# Patient Record
Sex: Female | Born: 1970 | Race: Black or African American | Hispanic: No | Marital: Single | Smoking: Never smoker
Health system: Southern US, Community
[De-identification: ages and names within clinical notes are randomized; demographics above are authoritative.]

## PROBLEM LIST (undated history)

## (undated) DIAGNOSIS — T7840XA Allergy, unspecified, initial encounter: Secondary | ICD-10-CM

## (undated) DIAGNOSIS — D573 Sickle-cell trait: Secondary | ICD-10-CM

## (undated) DIAGNOSIS — D571 Sickle-cell disease without crisis: Secondary | ICD-10-CM

## (undated) HISTORY — DX: Sickle-cell disease without crisis: D57.1

## (undated) HISTORY — DX: Allergy, unspecified, initial encounter: T78.40XA

## (undated) HISTORY — DX: Sickle-cell trait: D57.3

---

## 1994-05-27 HISTORY — PX: TUBAL LIGATION: SHX77

## 1996-05-27 HISTORY — PX: BREAST BIOPSY: SHX20

## 1998-12-19 ENCOUNTER — Encounter: Payer: Self-pay | Admitting: Internal Medicine

## 1998-12-19 ENCOUNTER — Emergency Department (HOSPITAL_COMMUNITY): Admission: EM | Admit: 1998-12-19 | Discharge: 1998-12-19 | Payer: Self-pay | Admitting: Internal Medicine

## 2000-10-23 ENCOUNTER — Encounter: Admission: RE | Admit: 2000-10-23 | Discharge: 2000-10-23 | Payer: Self-pay | Admitting: Family Medicine

## 2001-03-20 ENCOUNTER — Other Ambulatory Visit: Admission: RE | Admit: 2001-03-20 | Discharge: 2001-03-20 | Payer: Self-pay | Admitting: Obstetrics and Gynecology

## 2001-12-07 ENCOUNTER — Encounter: Admission: RE | Admit: 2001-12-07 | Discharge: 2001-12-07 | Payer: Self-pay | Admitting: Family Medicine

## 2001-12-07 ENCOUNTER — Encounter: Payer: Self-pay | Admitting: Family Medicine

## 2003-07-08 ENCOUNTER — Encounter: Admission: RE | Admit: 2003-07-08 | Discharge: 2003-07-08 | Payer: Self-pay | Admitting: Family Medicine

## 2004-02-08 ENCOUNTER — Encounter: Payer: Self-pay | Admitting: Family Medicine

## 2004-02-08 ENCOUNTER — Ambulatory Visit: Payer: Self-pay | Admitting: Family Medicine

## 2004-04-25 ENCOUNTER — Ambulatory Visit: Payer: Self-pay | Admitting: Family Medicine

## 2004-10-24 ENCOUNTER — Ambulatory Visit: Payer: Self-pay | Admitting: Family Medicine

## 2004-10-26 ENCOUNTER — Ambulatory Visit: Payer: Self-pay | Admitting: Family Medicine

## 2005-10-31 ENCOUNTER — Encounter (INDEPENDENT_AMBULATORY_CARE_PROVIDER_SITE_OTHER): Payer: Self-pay | Admitting: *Deleted

## 2005-10-31 LAB — CONVERTED CEMR LAB

## 2005-11-06 ENCOUNTER — Ambulatory Visit: Payer: Self-pay | Admitting: Family Medicine

## 2006-04-22 ENCOUNTER — Ambulatory Visit: Payer: Self-pay | Admitting: Family Medicine

## 2006-07-25 ENCOUNTER — Encounter (INDEPENDENT_AMBULATORY_CARE_PROVIDER_SITE_OTHER): Payer: Self-pay | Admitting: *Deleted

## 2006-08-12 ENCOUNTER — Telehealth: Payer: Self-pay | Admitting: *Deleted

## 2006-08-20 ENCOUNTER — Telehealth (INDEPENDENT_AMBULATORY_CARE_PROVIDER_SITE_OTHER): Payer: Self-pay | Admitting: *Deleted

## 2006-08-21 ENCOUNTER — Ambulatory Visit: Payer: Self-pay | Admitting: Sports Medicine

## 2007-02-13 ENCOUNTER — Encounter (INDEPENDENT_AMBULATORY_CARE_PROVIDER_SITE_OTHER): Payer: Self-pay | Admitting: Family Medicine

## 2007-02-13 ENCOUNTER — Ambulatory Visit: Payer: Self-pay | Admitting: Family Medicine

## 2007-02-13 ENCOUNTER — Other Ambulatory Visit: Admission: RE | Admit: 2007-02-13 | Discharge: 2007-02-13 | Payer: Self-pay | Admitting: Family Medicine

## 2007-02-13 LAB — CONVERTED CEMR LAB
Bilirubin Urine: NEGATIVE
Blood in Urine, dipstick: NEGATIVE
Chlamydia, DNA Probe: NEGATIVE
GC Probe Amp, Genital: NEGATIVE
Glucose, Urine, Semiquant: NEGATIVE
Ketones, urine, test strip: NEGATIVE
Nitrite: NEGATIVE
Protein, U semiquant: NEGATIVE
Specific Gravity, Urine: 1.02
Urobilinogen, UA: 0.2
Whiff Test: POSITIVE
pH: 6.5

## 2007-02-16 ENCOUNTER — Encounter (INDEPENDENT_AMBULATORY_CARE_PROVIDER_SITE_OTHER): Payer: Self-pay | Admitting: Family Medicine

## 2007-04-29 ENCOUNTER — Encounter: Payer: Self-pay | Admitting: *Deleted

## 2007-07-14 ENCOUNTER — Telehealth: Payer: Self-pay | Admitting: *Deleted

## 2007-07-20 ENCOUNTER — Encounter: Admission: RE | Admit: 2007-07-20 | Discharge: 2007-07-20 | Payer: Self-pay | Admitting: Obstetrics

## 2008-04-18 ENCOUNTER — Encounter (INDEPENDENT_AMBULATORY_CARE_PROVIDER_SITE_OTHER): Payer: Self-pay | Admitting: Family Medicine

## 2008-04-18 ENCOUNTER — Encounter: Payer: Self-pay | Admitting: *Deleted

## 2008-06-03 ENCOUNTER — Encounter (INDEPENDENT_AMBULATORY_CARE_PROVIDER_SITE_OTHER): Payer: Self-pay | Admitting: Family Medicine

## 2008-06-03 ENCOUNTER — Ambulatory Visit: Payer: Self-pay | Admitting: Family Medicine

## 2008-06-03 LAB — CONVERTED CEMR LAB
Beta hcg, urine, semiquantitative: NEGATIVE
Bilirubin Urine: NEGATIVE
Glucose, Urine, Semiquant: NEGATIVE
Protein, U semiquant: NEGATIVE
Specific Gravity, Urine: 1.02
WBC Urine, dipstick: NEGATIVE
pH: 5.5

## 2008-06-06 LAB — CONVERTED CEMR LAB: Chlamydia, DNA Probe: NEGATIVE

## 2008-11-08 ENCOUNTER — Telehealth (INDEPENDENT_AMBULATORY_CARE_PROVIDER_SITE_OTHER): Payer: Self-pay | Admitting: Family Medicine

## 2008-11-08 ENCOUNTER — Ambulatory Visit: Payer: Self-pay | Admitting: Family Medicine

## 2008-11-08 DIAGNOSIS — IMO0002 Reserved for concepts with insufficient information to code with codable children: Secondary | ICD-10-CM | POA: Insufficient documentation

## 2008-12-27 ENCOUNTER — Telehealth: Payer: Self-pay | Admitting: Family Medicine

## 2009-10-19 ENCOUNTER — Encounter: Payer: Self-pay | Admitting: Family Medicine

## 2009-10-19 ENCOUNTER — Ambulatory Visit: Payer: Self-pay | Admitting: Family Medicine

## 2009-10-19 DIAGNOSIS — B009 Herpesviral infection, unspecified: Secondary | ICD-10-CM | POA: Insufficient documentation

## 2009-10-19 DIAGNOSIS — R609 Edema, unspecified: Secondary | ICD-10-CM | POA: Insufficient documentation

## 2010-06-26 NOTE — Assessment & Plan Note (Signed)
Summary: facial swelling/[place]/carew   Vital Signs:  Patient profile:   40 year old female Weight:      178.2 pounds Temp:     98.4 degrees F oral Pulse rate:   68 / minute BP sitting:   112 / 69  (left arm) Cuff size:   regular  Vitals Entered By: Garen Grams LPN (Oct 19, 2009 10:56 AM) CC: lip swelling Is Patient Diabetic? No Pain Assessment Patient in pain? no        Primary Care Provider:  Bobby Rumpf  MD  CC:  lip swelling.  History of Present Illness: 1. Lip swelling and fever blister:  Pt noticed a fever blister on her upper lip yesterday.  She started putting Abreva on it yesterday and was doing fine when she went to bed last night.  She woke up this morning with her upper lip swollen and painful.  She was also having some face itching but no swelling.  The swelling is getting worse.  It is associated with facial itching but no rash.  She hasn't tried anything to make it better.  Nothing makes it worse.  She has never had something like this happen before.       ROS: she denies tongue swelling, throat swelling, difficulty swallowing, wheezing, difficult                 breathing.  She is unsure if her lower lip is swollen as well.      PMHx: No hx of allergic reactions before      Meds:  No new medicines besides the Abreva  Habits & Providers  Alcohol-Tobacco-Diet     Tobacco Status: never  Allergies: No Known Drug Allergies  Past History:  Social History: Last updated: 02/13/2007 58yrs post high school education, No tobacco or drug history.  EtOH socially.  Lives with 2 children and boyfriend.  works at [employer].  Physical Exam  General:  Well-developed,overweight,in no acute distress; alert,appropriate and cooperative throughout examination VSS Eyes:  normal conjuctiva Mouth:  Upper lip is grossly swollen in the middle.  She has a cluster of vesicles on the upper lip.  Bottom lip may be slightly swollen diffusely but this may be her baseline.  OP  pink and moist.  Normal appearing tongue.  Airway is open without compromise. Neck:  no soft tissue swelling or rashes Lungs:  Normal respiratory effort, chest expands symmetrically. Lungs are clear to auscultation, no crackles or wheezes. Heart:  normal rate and regular rhythm.   Skin:  vesicular rash on upper lip Psych:  not anxious appearing.   Impression & Recommendations:  Problem # 1:  EDEMA (ICD-782.3) Assessment New  Discussed case with Dr. Donalee Citrin.  Localized edema of the upper lip either due to HSV infection or reaction to the Abreva.  No airway obstruction or compromise.  Will treat with with H1/H2 blockers.  Will hold off on steroids given the herpes outbreak.  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 1 by mouth three times a day as needed for pain 2)  Valacyclovir Hcl 500 Mg Tabs (Valacyclovir hcl) .... Take 4 tabs by mouth twice a day for 1 day 3)  Hydroxyzine Hcl 25  Mg Tabs (Hydroxyzine hcl) .... Take 1 tab by mouth every twice a day for itching 4)  Zyrtec Allergy 10 Mg Tabs (Cetirizine hcl) .... Take 1 tab by mouth daily for itching 5)  Ranitidine Hcl 75 Mg Tabs (Ranitidine hcl) .... Take 1 tab by mouth twice a day as needed for itching  Patient Instructions: 1)  We will treat you for the blisters and for the swelling / itching 2)  For the blisters I want you to take Valtrex.  You will take 4 tabs twice a day for one day. 3)  For the swelling and the itching I have prescribed a couple different medicines 4)  If you start to notice tongue or throat swelling or difficulty breathing or the swelling gets much worse than you need to go to the Emergency Room Prescriptions: RANITIDINE HCL 75 MG TABS (RANITIDINE HCL)  Take 1 tab by mouth twice a day as needed for itching  #30 x 0   Entered and Authorized by:   Angelena Sole MD   Signed by:   Angelena Sole MD on 10/19/2009   Method used:   Electronically to        [facility] [id number]* (retail)       [address] [place], [place]  [address]       Ph: [phone]       Fax: [phone]   RxID:   [id number] ZYRTEC ALLERGY 10 MG TABS (CETIRIZINE HCL) Take 1 tab by mouth daily for itching  #20 x 0   Entered and Authorized by:   Angelena Sole MD   Signed by:   Angelena Sole MD on 10/19/2009   Method used:   Electronically to        [facility] #[id number]* (retail)       [address] [place], [place]  [address]       Ph: [phone]       Fax: [phone]   RxID:   [id number] HYDROXYZINE HCL 25 MG TABS (HYDROXYZINE HCL) Take 1 tab by mouth every twice a day for itching  #20 x 0   Entered and Authorized by:   Angelena Sole MD   Signed by:   Angelena Sole MD on 10/19/2009   Method used:   Electronically to        [id number]* (retail)       [address] [place], [place]  [address]       Ph: [phone]       Fax: [phone]   RxID:   [id number] VALACYCLOVIR HCL 500 MG TABS (VALACYCLOVIR HCL) take 4 tabs by mouth twice a day for 1 day  #8 x 0   Entered and Authorized by:   Angelena Sole MD   Signed by:   Angelena Sole MD on 10/19/2009   Method used:   Electronically to        [id number]* (retail)       [address] [place], [place]  [address]       Ph: [phone]       Fax: [phone]   RxID:   [id number]

## 2010-06-26 NOTE — Miscellaneous (Signed)
Summary: face swollen  Clinical Lists Changes states she has facial swelling. she had used abbreva on a fever blister & now has swelling. takes no meds per pt. asked her to come now. speaking well. no problems swallowing...Marland KitchenMarland KitchenGolden Circle RN  Oct 19, 2009 10:35 AM  Reviewed Dr. Lelon Perla note. Bobby Rumpf  MD  Oct 19, 2009 4:47 PM

## 2011-08-29 ENCOUNTER — Encounter: Payer: Self-pay | Admitting: Family Medicine

## 2012-06-22 ENCOUNTER — Telehealth: Payer: Self-pay | Admitting: Family Medicine

## 2012-06-22 NOTE — Telephone Encounter (Signed)
Please call patient per daughter.

## 2012-06-22 NOTE — Telephone Encounter (Signed)
Spoke with [name] for Clarification.  Daughter came in for an appt and in passing told [name] that her "mom needed a nurse to call her"  Attempted to call but no answer, LMOVM for pt to return call.  When she calls back please get some information.  Also pt has not been seen at all since epic.  If it is for medical related problems or med refills . [place], [name]

## 2016-08-13 ENCOUNTER — Ambulatory Visit (INDEPENDENT_AMBULATORY_CARE_PROVIDER_SITE_OTHER): Payer: BLUE CROSS/BLUE SHIELD | Admitting: Obstetrics

## 2016-08-13 ENCOUNTER — Encounter: Payer: Self-pay | Admitting: Obstetrics

## 2016-08-13 VITALS — BP 88/59 | HR 70 | Ht 60.0 in | Wt 156.0 lb

## 2016-08-13 DIAGNOSIS — Z124 Encounter for screening for malignant neoplasm of cervix: Secondary | ICD-10-CM

## 2016-08-13 DIAGNOSIS — Z113 Encounter for screening for infections with a predominantly sexual mode of transmission: Secondary | ICD-10-CM

## 2016-08-13 DIAGNOSIS — N946 Dysmenorrhea, unspecified: Secondary | ICD-10-CM

## 2016-08-13 DIAGNOSIS — Z0000 Encounter for general adult medical examination without abnormal findings: Secondary | ICD-10-CM

## 2016-08-13 DIAGNOSIS — Z01419 Encounter for gynecological examination (general) (routine) without abnormal findings: Secondary | ICD-10-CM

## 2016-08-13 DIAGNOSIS — N939 Abnormal uterine and vaginal bleeding, unspecified: Secondary | ICD-10-CM

## 2016-08-13 DIAGNOSIS — Z1239 Encounter for other screening for malignant neoplasm of breast: Secondary | ICD-10-CM

## 2016-08-13 MED ORDER — IBUPROFEN 800 MG PO TABS: 800.0000 mg | ORAL_TABLET | Freq: Three times a day (TID) | ORAL | 5 refills | Status: DC | PRN

## 2016-08-13 NOTE — Progress Notes (Signed)
Pt presents for annual, pap, all STD testing, and MGM order. Pt c/o heavy cycles with half dollar size clots, changing tampons every 2 hrs. They are irregular, sometimes they are late and last 3-7 days.

## 2016-08-13 NOTE — Progress Notes (Signed)
Subjective:        Wanda Foster is a 46 y.o. female here for a routine exam.  Current complaints: Heavy and painful periods..    Personal health questionnaire:  Is patient Ashkenazi Jewish, have a family history of breast and/or ovarian cancer: no Is there a family history of uterine cancer diagnosed at age < 87, gastrointestinal cancer, urinary tract cancer, family member who is a Field seismologist syndrome-associated carrier: no Is the patient overweight and hypertensive, family history of diabetes, personal history of gestational diabetes, preeclampsia or PCOS: no Is patient over 60, have PCOS,  family history of premature CHD under age 48, diabetes, smoke, have hypertension or peripheral artery disease:  no At any time, has a partner hit, kicked or otherwise hurt or frightened you?: no Over the past 2 weeks, have you felt down, depressed or hopeless?: no Over the past 2 weeks, have you felt little interest or pleasure in doing things?:no   Gynecologic History Patient's last menstrual period was 08/06/2016. Contraception: tubal ligation Last Pap: Unknown. Results were: normal Last mammogram: 2009. Results were: normal  Obstetric History OB History  Gravida Para Term Preterm AB Living  4 2 2   2 2   SAB TAB Ectopic Multiple Live Births    2          # Outcome Date GA Lbr Len/2nd Weight Sex Delivery Anes PTL Lv  4 Term 10/04/92 [redacted]w[redacted]d   F CS-LTranv     3 Term 01/14/91 [redacted]w[redacted]d   F Vag-Spont     2 TAB           1 TAB               Past Medical History:  Diagnosis Date  . Sickle cell trait [facility])     Past Surgical History:  Procedure Laterality Date  . BREAST BIOPSY  1998   unsure which breast  . [place]  . TUBAL LIGATION  1996    No current outpatient prescriptions on file. No Known Allergies  Social History  Substance Use Topics  . Smoking status: Never Smoker  . Smokeless tobacco: Never Used  . Alcohol use Yes     Comment: occ    Family History  Problem  Relation Age of Onset  . Asthma Sister   . Sickle cell trait Sister   . Sickle cell trait Brother   . Diabetes Maternal Grandmother   . Hypertension Maternal Grandmother   . Cervical cancer Maternal Grandmother 60  . Diabetes Maternal Grandfather   . Hypertension Maternal Grandfather   . Diabetes Paternal Grandmother   . Hypertension Paternal Grandmother   . Diabetes Paternal Grandfather   . Hypertension Paternal Grandfather       Review of Systems  Constitutional: negative for fatigue and weight loss Respiratory: negative for cough and wheezing Cardiovascular: negative for chest pain, fatigue and palpitations Gastrointestinal: negative for abdominal pain and change in bowel habits Musculoskeletal:negative for myalgias Neurological: negative for gait problems and tremors Behavioral/Psych: negative for abusive relationship, depression Endocrine: negative for temperature intolerance    Genitourinary:positive for heavy and painful periods Integument/breast: negative for breast lump, breast tenderness, nipple discharge and skin lesion(s)    Objective:       BP (!) 88/59   Pulse 70   Ht 5' (1.524 m)   Wt 156 lb (70.8 kg)   LMP 08/06/2016   BMI 30.47 kg/m  General:   alert  Skin:   no rash  or abnormalities  Lungs:   clear to auscultation bilaterally  Heart:   regular rate and rhythm, S1, S2 normal, no murmur, click, rub or gallop  Breasts:   normal without suspicious masses, skin or nipple changes or axillary nodes  Abdomen:  normal findings: no organomegaly, soft, non-tender and no hernia  Pelvis:  External genitalia: normal general appearance Urinary system: urethral meatus normal and bladder without fullness, nontender Vaginal: normal without tenderness, induration or masses Cervix: normal appearance Adnexa: normal bimanual exam Uterus: anteverted and non-tender, normal size   Lab Review Urine pregnancy test Labs reviewed yes Radiologic studies reviewed yes  50%  of 20 min visit spent on counseling and coordination of care.    Assessment:    Healthy female exam.    Dysmenorrhea   Plan:   Ibuprofen Rx with instructions for Dysmenorrhea  Education reviewed: calcium supplements, low fat, low cholesterol diet, safe sex/STD prevention, self breast exams and weight bearing exercise. Contraception: tubal ligation. Mammogram ordered. Follow up in: 1 year.   No orders of the defined types were placed in this encounter.  Orders Placed This Encounter  Procedures  . Hepatitis C antibody  . Hepatitis B surface antigen  . HIV antibody  . RPR     Patient ID: DRISHTI Foster, female   DOB: Oct 02, 1970, 46 y.o.   MRN: [id number]

## 2016-08-14 LAB — HEPATITIS B SURFACE ANTIGEN: Hepatitis B Surface Ag: NEGATIVE

## 2016-08-14 LAB — CERVICOVAGINAL ANCILLARY ONLY
Bacterial vaginitis: POSITIVE — AB
CHLAMYDIA, DNA PROBE: NEGATIVE
Candida vaginitis: NEGATIVE
Neisseria Gonorrhea: NEGATIVE
TRICH (WINDOWPATH): POSITIVE — AB

## 2016-08-14 LAB — HIV ANTIBODY (ROUTINE TESTING W REFLEX): HIV SCREEN 4TH GENERATION: NONREACTIVE

## 2016-08-14 LAB — HEPATITIS C ANTIBODY

## 2016-08-14 LAB — RPR: RPR: NONREACTIVE

## 2016-08-15 ENCOUNTER — Other Ambulatory Visit: Payer: Self-pay | Admitting: Obstetrics

## 2016-08-15 DIAGNOSIS — B9689 Other specified bacterial agents as the cause of diseases classified elsewhere: Secondary | ICD-10-CM

## 2016-08-15 DIAGNOSIS — A5901 Trichomonal vulvovaginitis: Secondary | ICD-10-CM

## 2016-08-15 DIAGNOSIS — N760 Acute vaginitis: Principal | ICD-10-CM

## 2016-08-15 LAB — CYTOLOGY - PAP
DIAGNOSIS: NEGATIVE
HPV: NOT DETECTED

## 2016-08-15 MED ORDER — METRONIDAZOLE 500 MG PO TABS
2000.0000 mg | ORAL_TABLET | Freq: Once | ORAL | 0 refills | Status: AC
Start: 2016-08-15 — End: 2016-08-15

## 2016-08-15 MED ORDER — CLINDAMYCIN HCL 300 MG PO CAPS: 300.0000 mg | ORAL_CAPSULE | Freq: Three times a day (TID) | ORAL | 0 refills | Status: DC

## 2016-08-16 ENCOUNTER — Other Ambulatory Visit: Payer: Self-pay | Admitting: Obstetrics

## 2016-08-16 DIAGNOSIS — A5901 Trichomonal vulvovaginitis: Secondary | ICD-10-CM

## 2016-08-16 MED ORDER — METRONIDAZOLE 500 MG PO TABS: 2000.0000 mg | ORAL_TABLET | Freq: Once | ORAL | 0 refills | Status: AC

## 2016-09-02 ENCOUNTER — Ambulatory Visit
Admission: RE | Admit: 2016-09-02 | Discharge: 2016-09-02 | Disposition: A | Discharge status: Home / Self Care | Payer: BLUE CROSS/BLUE SHIELD | Source: Ambulatory Visit | Attending: Obstetrics | Admitting: Obstetrics

## 2016-09-02 DIAGNOSIS — Z1239 Encounter for other screening for malignant neoplasm of breast: Secondary | ICD-10-CM

## 2016-09-30 ENCOUNTER — Ambulatory Visit: Payer: BLUE CROSS/BLUE SHIELD | Admitting: Obstetrics

## 2016-10-08 ENCOUNTER — Ambulatory Visit: Payer: BLUE CROSS/BLUE SHIELD | Admitting: Obstetrics

## 2016-10-30 ENCOUNTER — Ambulatory Visit: Payer: BLUE CROSS/BLUE SHIELD | Admitting: Obstetrics

## 2017-07-25 ENCOUNTER — Other Ambulatory Visit: Payer: Self-pay | Admitting: Obstetrics

## 2017-07-25 DIAGNOSIS — Z1231 Encounter for screening mammogram for malignant neoplasm of breast: Secondary | ICD-10-CM

## 2017-09-08 ENCOUNTER — Ambulatory Visit
Admission: RE | Admit: 2017-09-08 | Discharge: 2017-09-08 | Disposition: A | Discharge status: Home / Self Care | Payer: BLUE CROSS/BLUE SHIELD | Source: Ambulatory Visit | Attending: Obstetrics | Admitting: Obstetrics

## 2017-09-08 DIAGNOSIS — Z1231 Encounter for screening mammogram for malignant neoplasm of breast: Secondary | ICD-10-CM

## 2017-09-09 ENCOUNTER — Other Ambulatory Visit: Payer: Self-pay | Admitting: Obstetrics

## 2017-09-09 DIAGNOSIS — N6452 Nipple discharge: Secondary | ICD-10-CM

## 2017-09-12 ENCOUNTER — Encounter (INDEPENDENT_AMBULATORY_CARE_PROVIDER_SITE_OTHER): Payer: Self-pay

## 2017-09-12 ENCOUNTER — Ambulatory Visit
Admission: RE | Admit: 2017-09-12 | Discharge: 2017-09-12 | Disposition: A | Discharge status: Home / Self Care | Payer: BLUE CROSS/BLUE SHIELD | Source: Ambulatory Visit | Attending: Obstetrics | Admitting: Obstetrics

## 2017-09-12 ENCOUNTER — Other Ambulatory Visit: Payer: Self-pay | Admitting: Obstetrics

## 2017-09-12 ENCOUNTER — Ambulatory Visit: Payer: BLUE CROSS/BLUE SHIELD

## 2017-09-12 DIAGNOSIS — Z1231 Encounter for screening mammogram for malignant neoplasm of breast: Secondary | ICD-10-CM

## 2017-09-12 DIAGNOSIS — N6452 Nipple discharge: Secondary | ICD-10-CM

## 2017-09-24 ENCOUNTER — Ambulatory Visit: Payer: BLUE CROSS/BLUE SHIELD | Admitting: Obstetrics

## 2017-10-02 ENCOUNTER — Encounter: Payer: Self-pay | Admitting: Obstetrics

## 2017-10-02 ENCOUNTER — Ambulatory Visit (INDEPENDENT_AMBULATORY_CARE_PROVIDER_SITE_OTHER): Payer: BLUE CROSS/BLUE SHIELD | Admitting: Obstetrics

## 2017-10-02 VITALS — BP 103/68 | HR 60 | Ht 59.0 in | Wt 161.3 lb

## 2017-10-02 DIAGNOSIS — Z113 Encounter for screening for infections with a predominantly sexual mode of transmission: Secondary | ICD-10-CM | POA: Diagnosis not present

## 2017-10-02 DIAGNOSIS — Z124 Encounter for screening for malignant neoplasm of cervix: Secondary | ICD-10-CM

## 2017-10-02 DIAGNOSIS — Z1151 Encounter for screening for human papillomavirus (HPV): Secondary | ICD-10-CM | POA: Diagnosis not present

## 2017-10-02 DIAGNOSIS — N760 Acute vaginitis: Secondary | ICD-10-CM | POA: Diagnosis not present

## 2017-10-02 DIAGNOSIS — Z01419 Encounter for gynecological examination (general) (routine) without abnormal findings: Secondary | ICD-10-CM | POA: Diagnosis not present

## 2017-10-02 DIAGNOSIS — N946 Dysmenorrhea, unspecified: Secondary | ICD-10-CM

## 2017-10-02 MED ORDER — IBUPROFEN 800 MG PO TABS: 800.0000 mg | ORAL_TABLET | Freq: Three times a day (TID) | ORAL | 5 refills | Status: DC | PRN

## 2017-10-02 NOTE — Progress Notes (Signed)
Patient presents for Annual exam today.  CC: pt noticed milk leak from breast a week ago and breast have been tender.  BTL   Last pap:08/13/16 wnl  Mammogram: 09/03/2017 wnl  STD Screening: Full panel

## 2017-10-02 NOTE — Progress Notes (Signed)
Subjective:        Wanda Foster is a 47 y.o. female here for a routine exam.  Current complaints: Irregular cycles.  Denies intermenstrual bleeding.    Personal health questionnaire:  Is patient Ashkenazi Jewish, have a family history of breast and/or ovarian cancer: no Is there a family history of uterine cancer diagnosed at age < 40, gastrointestinal cancer, urinary tract cancer, family member who is a Field seismologist syndrome-associated carrier: no Is the patient overweight and hypertensive, family history of diabetes, personal history of gestational diabetes, preeclampsia or PCOS: no Is patient over 82, have PCOS,  family history of premature CHD under age 45, diabetes, smoke, have hypertension or peripheral artery disease:  no At any time, has a partner hit, kicked or otherwise hurt or frightened you?: no Over the past 2 weeks, have you felt down, depressed or hopeless?: no Over the past 2 weeks, have you felt little interest or pleasure in doing things?:no   Gynecologic History Patient's last menstrual period was 09/01/2017 (approximate). Contraception: tubal ligation Last Pap: 2018. Results were: normal Last mammogram: 2019. Results were: normal  Obstetric History OB History  Gravida Para Term Preterm AB Living  4 2 2   2 2   SAB TAB Ectopic Multiple Live Births    2          # Outcome Date GA Lbr Len/2nd Weight Sex Delivery Anes PTL Lv  4 Term 10/04/92 [redacted]w[redacted]d   F CS-LTranv     3 Term 01/14/91 [redacted]w[redacted]d   F Vag-Spont     2 TAB           1 TAB             Past Medical History:  Diagnosis Date  . Sickle cell trait [facility])     Past Surgical History:  Procedure Laterality Date  . BREAST BIOPSY  1998   unsure which breast  . [place]  . TUBAL LIGATION  1996     Current Outpatient Medications:  .  clindamycin (CLEOCIN) 300 MG capsule, Take 1 capsule (300 mg total) by mouth 3 (three) times daily., Disp: 30 capsule, Rfl: 0 .  ibuprofen (ADVIL,MOTRIN) 800 MG tablet,  Take 1 tablet (800 mg total) by mouth every 8 (eight) hours as needed., Disp: 60 tablet, Rfl: 5 No Known Allergies  Social History   Tobacco Use  . Smoking status: Never Smoker  . Smokeless tobacco: Never Used  Substance Use Topics  . Alcohol use: Yes    Comment: occ    Family History  Problem Relation Age of Onset  . Asthma Sister   . Sickle cell trait Sister   . Sickle cell trait Brother   . Diabetes Maternal Grandmother   . Hypertension Maternal Grandmother   . Cervical cancer Maternal Grandmother 60  . Diabetes Maternal Grandfather   . Hypertension Maternal Grandfather   . Diabetes Paternal Grandmother   . Hypertension Paternal Grandmother   . Diabetes Paternal Grandfather   . Hypertension Paternal Grandfather   . Breast cancer Neg Hx       Review of Systems  Constitutional: negative for fatigue and weight loss Respiratory: negative for cough and wheezing Cardiovascular: negative for chest pain, fatigue and palpitations Gastrointestinal: negative for abdominal pain and change in bowel habits Musculoskeletal:negative for myalgias Neurological: negative for gait problems and tremors Behavioral/Psych: negative for abusive relationship, depression Endocrine: negative for temperature intolerance    Genitourinary:POSITIVE for irregular cycles Integument/breast: POSITIVE for milky  nipple discharge and breast tenderness a week ago     Objective:       BP 103/68   Pulse 60   Ht 4\' 11"  (1.499 m)   Wt 161 lb 4.8 oz (73.2 kg)   LMP 09/01/2017 (Approximate)   BMI 32.58 kg/m  General:   alert  Skin:   no rash or abnormalities  Lungs:   clear to auscultation bilaterally  Heart:   regular rate and rhythm, S1, S2 normal, no murmur, click, rub or gallop  Breasts:   normal without suspicious masses, skin or nipple changes or axillary nodes  Abdomen:  normal findings: no organomegaly, soft, non-tender and no hernia  Pelvis:  External genitalia: normal general  appearance Urinary system: urethral meatus normal and bladder without fullness, nontender Vaginal: normal without tenderness, induration or masses Cervix: normal appearance Adnexa: normal bimanual exam Uterus: anteverted and non-tender, normal size   Lab Review Urine pregnancy test Labs reviewed yes Radiologic studies reviewed yes  50% of 20 min visit spent on counseling and coordination of care.   Assessment:     1. Women's annual routine gynecological examination Rx: - Cervicovaginal ancillary only  2. Screening for cervical cancer Rx: - Cytology - PAP  3. Dysmenorrhea Rx: - ibuprofen (ADVIL,MOTRIN) 800 MG tablet; Take 1 tablet (800 mg total) by mouth every 8 (eight) hours as needed.  Dispense: 60 tablet; Refill: 5    Plan:    Education reviewed: calcium supplements, depression evaluation, low fat, low cholesterol diet, safe sex/STD prevention, self breast exams and weight bearing exercise. Follow up in: 1 year.   Meds ordered this encounter  Medications  . ibuprofen (ADVIL,MOTRIN) 800 MG tablet    Sig: Take 1 tablet (800 mg total) by mouth every 8 (eight) hours as needed.    Dispense:  60 tablet    Refill:  5   No orders of the defined types were placed in this encounter.   Shelly Bombard MD 10-02-2017

## 2017-10-03 LAB — CERVICOVAGINAL ANCILLARY ONLY
Bacterial vaginitis: POSITIVE — AB
CHLAMYDIA, DNA PROBE: NEGATIVE
Candida vaginitis: NEGATIVE
NEISSERIA GONORRHEA: NEGATIVE
TRICH (WINDOWPATH): NEGATIVE

## 2017-10-04 ENCOUNTER — Other Ambulatory Visit: Payer: Self-pay | Admitting: Obstetrics

## 2017-10-04 DIAGNOSIS — N760 Acute vaginitis: Principal | ICD-10-CM

## 2017-10-04 DIAGNOSIS — B9689 Other specified bacterial agents as the cause of diseases classified elsewhere: Secondary | ICD-10-CM

## 2017-10-04 MED ORDER — CLINDAMYCIN HCL 300 MG PO CAPS: 300.0000 mg | ORAL_CAPSULE | Freq: Three times a day (TID) | ORAL | 0 refills | Status: DC

## 2017-10-07 LAB — CYTOLOGY - PAP
DIAGNOSIS: NEGATIVE
HPV: NOT DETECTED

## 2018-04-16 ENCOUNTER — Telehealth: Payer: Self-pay | Admitting: *Deleted

## 2018-04-16 NOTE — Telephone Encounter (Signed)
Pt called to office and ask for return call.  Return call to pt. Pt had questions regarding labs from last visit. Discussed with pt and had no other concerns.

## 2018-10-26 ENCOUNTER — Other Ambulatory Visit: Payer: Self-pay | Admitting: Obstetrics

## 2018-10-26 DIAGNOSIS — Z1231 Encounter for screening mammogram for malignant neoplasm of breast: Secondary | ICD-10-CM

## 2018-10-27 ENCOUNTER — Ambulatory Visit
Admission: RE | Admit: 2018-10-27 | Discharge: 2018-10-27 | Disposition: A | Discharge status: Home / Self Care | Payer: BLUE CROSS/BLUE SHIELD | Source: Ambulatory Visit | Attending: Obstetrics | Admitting: Obstetrics

## 2018-10-27 ENCOUNTER — Other Ambulatory Visit: Payer: Self-pay

## 2018-10-27 DIAGNOSIS — Z1231 Encounter for screening mammogram for malignant neoplasm of breast: Secondary | ICD-10-CM

## 2018-11-26 ENCOUNTER — Encounter: Payer: Self-pay | Admitting: Obstetrics and Gynecology

## 2018-11-26 ENCOUNTER — Other Ambulatory Visit: Payer: Self-pay

## 2018-11-26 ENCOUNTER — Ambulatory Visit (INDEPENDENT_AMBULATORY_CARE_PROVIDER_SITE_OTHER): Payer: BLUE CROSS/BLUE SHIELD | Admitting: Obstetrics and Gynecology

## 2018-11-26 VITALS — BP 100/64 | HR 68 | Ht 64.0 in | Wt 178.0 lb

## 2018-11-26 DIAGNOSIS — E669 Obesity, unspecified: Secondary | ICD-10-CM

## 2018-11-26 DIAGNOSIS — Z01419 Encounter for gynecological examination (general) (routine) without abnormal findings: Secondary | ICD-10-CM | POA: Diagnosis not present

## 2018-11-26 DIAGNOSIS — Z113 Encounter for screening for infections with a predominantly sexual mode of transmission: Secondary | ICD-10-CM

## 2018-11-26 NOTE — Progress Notes (Signed)
GYNECOLOGY ANNUAL PREVENTATIVE CARE ENCOUNTER NOTE  Subjective:   Wanda Foster is a 48 y.o. [phone] female here for a annual gynecologic exam. Current complaints: occasionally has clotting with periods. Reports monthly periods lasting 3-5 days. Occasionally heavier than others.  Denies abnormal vaginal bleeding, discharge, pelvic pain, problems with intercourse or other gynecologic concerns.    Gynecologic History Patient's last menstrual period was 10/31/2018 (approximate). Contraception: tubal ligation Last Pap: 09/2017. Results were: negative Last mammogram: 10/2018. Results were: [place] 1  Obstetric History OB History  Gravida Para Term Preterm AB Living  4 2 2   2 2   SAB TAB Ectopic Multiple Live Births    2          # Outcome Date GA Lbr Len/2nd Weight Sex Delivery Anes PTL Lv  4 Term 10/04/92 [redacted]w[redacted]d   F CS-LTranv     3 Term 01/14/91 [redacted]w[redacted]d   F Vag-Spont     2 TAB           1 TAB            Past Medical History:  Diagnosis Date  . Sickle cell trait [facility])    Past Surgical History:  Procedure Laterality Date  . BREAST BIOPSY  1998   unsure which breast  . [place]  . TUBAL LIGATION  1996    Current Outpatient Medications on File Prior to Visit  Medication Sig Dispense Refill  . ibuprofen (ADVIL,MOTRIN) 800 MG tablet Take 1 tablet (800 mg total) by mouth every 8 (eight) hours as needed. (Patient not taking: Reported on 11/26/2018) 60 tablet 5   No current facility-administered medications on file prior to visit.     No Known Allergies  Social History   Socioeconomic History  . Marital status: Single    Spouse name: Not on file  . Number of children: Not on file  . Years of education: Not on file  . Highest education level: Not on file  Occupational History  . Not on file  Social Needs  . Financial resource strain: Not on file  . Food insecurity    Worry: Not on file    Inability: Not on file  . Transportation needs    Medical: Not on file     Non-medical: Not on file  Tobacco Use  . Smoking status: Never Smoker  . Smokeless tobacco: Never Used  Substance and Sexual Activity  . Alcohol use: Yes    Comment: occ  . Drug use: No  . Sexual activity: Yes    Partners: Male    Birth control/protection: Surgical    Comment: BTL   Lifestyle  . Physical activity    Days per week: Not on file    Minutes per session: Not on file  . Stress: Not on file  Relationships  . Social Herbalist on phone: Not on file    Gets together: Not on file    Attends religious service: Not on file    Active member of club or organization: Not on file    Attends meetings of clubs or organizations: Not on file    Relationship status: Not on file  . Intimate partner violence    Fear of current or ex partner: Not on file    Emotionally abused: Not on file    Physically abused: Not on file    Forced sexual activity: Not on file  Other Topics Concern  . Not on file  Social History Narrative  . Not on file    Family History  Problem Relation Age of Onset  . Asthma Sister   . Sickle cell trait Sister   . Sickle cell trait Brother   . Diabetes Maternal Grandmother   . Hypertension Maternal Grandmother   . Cervical cancer Maternal Grandmother 60  . Diabetes Maternal Grandfather   . Hypertension Maternal Grandfather   . Diabetes Paternal Grandmother   . Hypertension Paternal Grandmother   . Diabetes Paternal Grandfather   . Hypertension Paternal Grandfather   . Breast cancer Neg Hx     Diet: reports she eats healthy Exercise: exercises regularly, run and cycles  The following portions of the patient's history were reviewed and updated as appropriate: allergies, current medications, past family history, past medical history, past social history, past surgical history and problem list.  Review of Systems Pertinent items are noted in HPI.   Objective:  BP 100/64   Pulse 68   Ht 5\' 4"  (1.626 m)   Wt 178 lb (80.7 kg)   LMP  10/31/2018 (Approximate)   BMI 30.55 kg/m  CONSTITUTIONAL: Well-developed, well-nourished female in no acute distress.  HENT:  Normocephalic, atraumatic, External right and left ear normal. Oropharynx is clear and moist EYES: Conjunctivae and EOM are normal. Pupils are equal, round, and reactive to light. No scleral icterus.  NECK: Normal range of motion, supple, no masses.  Normal thyroid.  SKIN: Skin is warm and dry. No rash noted. Not diaphoretic. No erythema. No pallor. NEUROLOGIC: Alert and oriented to person, place, and time. Normal reflexes, muscle tone coordination. No cranial nerve deficit noted. PSYCHIATRIC: Normal mood and affect. Normal behavior. Normal judgment and thought content. CARDIOVASCULAR: Normal heart rate noted, regular rhythm RESPIRATORY: Clear to auscultation bilaterally. Effort and breath sounds normal, no problems with respiration noted. BREASTS: Symmetric in size. No masses, skin changes, nipple drainage, or lymphadenopathy. ABDOMEN: Soft, normal bowel sounds, no distention noted.  No tenderness, rebound or guarding.  PELVIC: Normal appearing external genitalia; normal appearing vaginal mucosa and cervix.  No abnormal discharge noted.  Pelvic cultures obtained  Normal uterine size, no other palpable masses, no uterine or adnexal tenderness. MUSCULOSKELETAL: Normal range of motion. No tenderness.  No cyanosis, clubbing, or edema.  2+ distal pulses.  Exam done with chaperone present.  Assessment and Plan:   1. Well woman exam No issues  2. Routine screening for STI (sexually transmitted infection) Patient request STI screen, no known contacts - HIV Antibody (routine testing w rflx) - Hepatitis B surface antigen - RPR - Hepatitis C antibody - Cervicovaginal ancillary only( [place])  3. Obesity (BMI 30-39.9) Reviewed importance of diet and exercise, patient requests referral to nutrition - Referral to Nutrition and Diabetes Services - TSH - T4 - T3    Will follow up results of STI screen and manage accordingly. Encouraged improvement in diet and exercise.  Mammogram UTD  Routine preventative health maintenance measures emphasized. Please refer to After Visit Summary for other counseling recommendations.    Feliz Beam, M.D. Attending Center for [employer] [facility])

## 2018-11-27 LAB — HEPATITIS B SURFACE ANTIGEN: Hepatitis B Surface Ag: NEGATIVE

## 2018-11-27 LAB — RPR: RPR Ser Ql: NONREACTIVE

## 2018-11-27 LAB — T3: T3, Total: 243 ng/dL — ABNORMAL HIGH (ref 71–180)

## 2018-11-27 LAB — HEPATITIS C ANTIBODY: Hep C Virus Ab: <0.1 s/co ratio (ref 0.0–0.9)

## 2018-11-27 LAB — T4: T4, Total: 7.2 ug/dL (ref 4.5–12.0)

## 2018-11-27 LAB — HIV ANTIBODY (ROUTINE TESTING W REFLEX): HIV Screen 4th Generation wRfx: NONREACTIVE

## 2018-11-27 LAB — TSH: TSH: 0.275 u[IU]/mL — ABNORMAL LOW (ref 0.450–4.500)

## 2018-11-29 ENCOUNTER — Other Ambulatory Visit: Payer: Self-pay | Admitting: Obstetrics

## 2018-11-29 DIAGNOSIS — N946 Dysmenorrhea, unspecified: Secondary | ICD-10-CM

## 2018-12-01 LAB — CERVICOVAGINAL ANCILLARY ONLY
Chlamydia: NEGATIVE
Neisseria Gonorrhea: NEGATIVE
Trichomonas: NEGATIVE

## 2019-01-14 ENCOUNTER — Other Ambulatory Visit: Payer: Self-pay

## 2019-01-14 ENCOUNTER — Encounter: Payer: BLUE CROSS/BLUE SHIELD | Attending: Obstetrics and Gynecology | Admitting: Registered"

## 2019-01-14 ENCOUNTER — Encounter: Payer: Self-pay | Admitting: Registered"

## 2019-01-14 DIAGNOSIS — E669 Obesity, unspecified: Secondary | ICD-10-CM | POA: Insufficient documentation

## 2019-01-14 NOTE — Patient Instructions (Addendum)
Instructions/Goals:  Make sure to get in three meals per day. Try to have balanced meals like the My Plate example (see handout). Include lean proteins, vegetables, fruits, and whole grains at meals.   Goal #1: Eat 3 meals per day/eat every 4-5 hours while awake.    Goal #2: Practice Mindful Eating  At meal and snack times, put away electronics (TV, phone, tablet, etc.) and try to eat seated at a table so you can better focus on eating your meal/snack and promote listening to your body's fullness and hunger signals.  Make physical activity a part of your week. Regular physical activity promotes overall health-including helping to reduce risk for heart disease and diabetes, promoting mental health, and helping [place] sleep better.    Goal #3: Continue including at least 30-60 minutes 5 days per week. For further health benefits will want to gradually increase intensity and length of activity.

## 2019-01-14 NOTE — Progress Notes (Signed)
Medical Nutrition Therapy:  Appt start time: 6283 end time:  1517.  Assessment:  Primary concerns today: Pt referred due to weight management. Pt present for appointment alone. Pt reports she has had weight gain and has been concerned about her thyroid. Reports she would like to know how to eat healthy and what not to eat. Reports she knows she does not eat like she should. Pt says her meals are inconsistent d/t lack of appetite at times. Pt reports she drinks a lot of water.   Food Allergies/Intolerances: None reported.   GI Concerns: None reported.   Pertinent Lab Values: N/A  Weight Hx: See chart.   Preferred Learning Style:   No preference indicated   Learning Readiness:   Ready  MEDICATIONS: Reviewed.    DIETARY INTAKE:  Usual eating pattern includes 1-3 meals and sometimes has a snack. Common foods: shrimp, chicken, salmon, rice, broccoli, zucchini, eggs, bagel, cream cheese.  Avoided foods: none reported.    Typical Snacks: pistachios, light and salty potato chips, rice cakes, Oreo thins.     Typical Beverages: at least 80 oz water daily; occasionally soda but not usually; occasionally milk or juice with breakfast.  Location of Meals: sofa.   Electronics Present at [employer]: Yes: TV  24-hr recall:  B ( AM): egg bites, [employer] 16 oz white mocha  Snk ( AM):  pistachios  L ( PM): 1 piece [employer] chicken breast, some fries, Dr. Malachi Bonds (small amount)  Snk (PM): None reported.  D (7 PM): baked chicken, cream cheese and kale, water  Snk ( PM): at least 5 Thin Oreos  Beverages: usually at least 80 oz water; small amount of Dr. Malachi Bonds; 16 oz white chocolate mocha drink   Usual physical activity: walking  Minutes/Week: 5-7 days x 3 miles each time = ~45 minutes x 5-7 days per week  Progress Towards Goal(s):  In progress.   Nutritional Diagnosis:  NI-5.11.1 Predicted suboptimal nutrient intake As related to inconsistent eating pattern .  As evidenced by pt's reported  dietary recall and habits .    Intervention:  Nutrition counseling provided. Dietitian provided education regarding balanced nutrition, mindful eating and importance of having a consistent eating pattern. Pt reports she feels getting on a consistent eating pattern is her biggest problem. Discussed that for increased health benefits can increase intensity and length of physical activities. Pt appeared agreeable to information/goals discussed.   Instructions/Goals:  Make sure to get in three meals per day. Try to have balanced meals like the My Plate example (see handout). Include lean proteins, vegetables, fruits, and whole grains at meals.   Goal #1: Eat 3 meals per day/eat every 4-5 hours while awake.    Goal #2: Practice Mindful Eating  At meal and snack times, put away electronics (TV, phone, tablet, etc.) and try to eat seated at a table so you can better focus on eating your meal/snack and promote listening to your body's fullness and hunger signals.  Make physical activity a part of your week. Regular physical activity promotes overall health-including helping to reduce risk for heart disease and diabetes, promoting mental health, and helping [place] sleep better.    Goal #3: Continue including at least 30-60 minutes 5 days per week. For further health benefits will want to gradually increase intensity and length of activity.  Teaching Method Utilized:  Visual Auditory  Handouts given during visit include:  Balanced plate and food list.   Barriers to learning/adherence to lifestyle change: None indicated.  Demonstrated degree of understanding via:  Teach Back   Monitoring/Evaluation:  Dietary intake, exercise, and body weight prn. Pt reports she will work on the recommendations discussed and if she needs additional f/u to meet goals she will call the office.

## 2019-11-08 ENCOUNTER — Other Ambulatory Visit: Payer: Self-pay | Admitting: Obstetrics

## 2019-11-08 DIAGNOSIS — Z1231 Encounter for screening mammogram for malignant neoplasm of breast: Secondary | ICD-10-CM

## 2019-11-24 ENCOUNTER — Other Ambulatory Visit: Payer: Self-pay

## 2019-11-24 ENCOUNTER — Ambulatory Visit
Admission: RE | Admit: 2019-11-24 | Discharge: 2019-11-24 | Disposition: A | Discharge status: Home / Self Care | Payer: BC Managed Care – PPO | Source: Ambulatory Visit | Attending: Obstetrics | Admitting: Obstetrics

## 2019-11-24 DIAGNOSIS — Z1231 Encounter for screening mammogram for malignant neoplasm of breast: Secondary | ICD-10-CM

## 2020-01-05 ENCOUNTER — Encounter: Payer: Self-pay | Admitting: Obstetrics and Gynecology

## 2020-01-05 ENCOUNTER — Other Ambulatory Visit (HOSPITAL_COMMUNITY)
Admission: RE | Admit: 2020-01-05 | Discharge: 2020-01-05 | Disposition: A | Discharge status: Home / Self Care | Payer: BC Managed Care – PPO | Source: Ambulatory Visit | Attending: Obstetrics and Gynecology | Admitting: Obstetrics and Gynecology

## 2020-01-05 ENCOUNTER — Other Ambulatory Visit: Payer: Self-pay

## 2020-01-05 ENCOUNTER — Ambulatory Visit (INDEPENDENT_AMBULATORY_CARE_PROVIDER_SITE_OTHER): Payer: BC Managed Care – PPO | Admitting: Obstetrics and Gynecology

## 2020-01-05 VITALS — BP 91/61 | HR 64 | Ht 59.0 in | Wt 178.0 lb

## 2020-01-05 DIAGNOSIS — L989 Disorder of the skin and subcutaneous tissue, unspecified: Secondary | ICD-10-CM | POA: Diagnosis not present

## 2020-01-05 DIAGNOSIS — Z01419 Encounter for gynecological examination (general) (routine) without abnormal findings: Secondary | ICD-10-CM

## 2020-01-05 DIAGNOSIS — Z124 Encounter for screening for malignant neoplasm of cervix: Secondary | ICD-10-CM | POA: Insufficient documentation

## 2020-01-05 DIAGNOSIS — Z113 Encounter for screening for infections with a predominantly sexual mode of transmission: Secondary | ICD-10-CM

## 2020-01-05 DIAGNOSIS — N946 Dysmenorrhea, unspecified: Secondary | ICD-10-CM

## 2020-01-05 MED ORDER — IBUPROFEN 800 MG PO TABS: 800.0000 mg | ORAL_TABLET | Freq: Three times a day (TID) | ORAL | 3 refills | Status: DC | PRN

## 2020-01-05 NOTE — Progress Notes (Signed)
GYNECOLOGY ANNUAL PREVENTATIVE CARE ENCOUNTER NOTE  Subjective:   Wanda Foster is a 49 y.o. [phone] female here for a annual gynecologic exam. Current complaints: cramps with periods. Heavy flow, not really bothered by flow, sometimes feels clots. Main issue is with cramps, which improves with extra strength ibuprofen.   Denies abnormal vaginal bleeding, discharge, pelvic pain, problems with intercourse or other gynecologic concerns. Declines STI screen.   Gynecologic History Patient's last menstrual period was 12/31/2019. Contraception: tubal ligation Last Pap: 09/2017. Results: normal Last mammogram: 11/2019. Results: [place] 1 DEXA: has never had  Obstetric History OB History  Gravida Para Term Preterm AB Living  4 2 2   2 2   SAB TAB Ectopic Multiple Live Births    2          # Outcome Date GA Lbr Len/2nd Weight Sex Delivery Anes PTL Lv  4 Term 10/04/92 [redacted]w[redacted]d   F CS-LTranv     3 Term 01/14/91 [redacted]w[redacted]d   F Vag-Spont     2 TAB           1 TAB             Past Medical History:  Diagnosis Date  . Sickle cell trait [facility])     Past Surgical History:  Procedure Laterality Date  . BREAST BIOPSY  1998   unsure which breast  . [place]  . TUBAL LIGATION  1996    Current Outpatient Medications on File Prior to Visit  Medication Sig Dispense Refill  . BIOTIN PO Take by mouth. (Patient not taking: Reported on 01/05/2020)     No current facility-administered medications on file prior to visit.    No Known Allergies  Social History   Socioeconomic History  . Marital status: Single    Spouse name: Not on file  . Number of children: Not on file  . Years of education: Not on file  . Highest education level: Not on file  Occupational History  . Not on file  Tobacco Use  . Smoking status: Never Smoker  . Smokeless tobacco: Never Used  Vaping Use  . Vaping Use: Never used  Substance and Sexual Activity  . Alcohol use: Yes    Comment: occ  . Drug use: No    . Sexual activity: Yes    Partners: Male    Birth control/protection: Surgical    Comment: BTL   Other Topics Concern  . Not on file  Social History Narrative  . Not on file   Social Determinants of Health   Financial Resource Strain:   . Difficulty of Paying Living Expenses:   Food Insecurity:   . Worried About Charity fundraiser in the Last Year:   . Arboriculturist in the Last Year:   Transportation Needs:   . Film/video editor (Medical):   Marland Kitchen Lack of Transportation (Non-Medical):   Physical Activity:   . Days of Exercise per Week:   . Minutes of Exercise per Session:   Stress:   . Feeling of Stress :   Social Connections:   . Frequency of Communication with Friends and Family:   . Frequency of Social Gatherings with Friends and Family:   . Attends Religious Services:   . Active Member of Clubs or Organizations:   . Attends Archivist Meetings:   Marland Kitchen Marital Status:   Intimate Partner Violence:   . Fear of Current or Ex-Partner:   . Emotionally  Abused:   Marland Kitchen Physically Abused:   . Sexually Abused:     Family History  Problem Relation Age of Onset  . Asthma Sister   . Sickle cell trait Sister   . Sickle cell trait Brother   . Diabetes Maternal Grandmother   . Hypertension Maternal Grandmother   . Cervical cancer Maternal Grandmother 60  . Diabetes Maternal Grandfather   . Hypertension Maternal Grandfather   . Diabetes Paternal Grandmother   . Hypertension Paternal Grandmother   . Diabetes Paternal Grandfather   . Hypertension Paternal Grandfather   . Breast cancer Neg Hx     The following portions of the patient's history were reviewed and updated as appropriate: allergies, current medications, past family history, past medical history, past social history, past surgical history and problem list.  Review of Systems Pertinent items are noted in HPI.   Objective:  BP 91/61   Pulse 64   Ht 4\' 11"  (1.499 m)   Wt 178 lb (80.7 kg)   LMP  12/31/2019   BMI 35.95 kg/m  CONSTITUTIONAL: Well-developed, well-nourished female in no acute distress.  HENT:  Normocephalic, atraumatic, External right and left ear normal. Oropharynx is clear and moist EYES: Conjunctivae and EOM are normal. Pupils are equal, round, and reactive to light. No scleral icterus.  NECK: Normal range of motion, supple, no masses.  Normal thyroid.  SKIN: Skin is warm and dry. No rash noted. Not diaphoretic. No erythema. No pallor. NEUROLOGIC: Alert and oriented to person, place, and time. Normal reflexes, muscle tone coordination. No cranial nerve deficit noted. PSYCHIATRIC: Normal mood and affect. Normal behavior. Normal judgment and thought content. CARDIOVASCULAR: Normal heart rate noted RESPIRATORY: Effort normal, no problems with respiration noted. BREASTS: Symmetric in size. No masses, skin changes, nipple drainage, or lymphadenopathy. ABDOMEN: Soft, no distention noted.  No tenderness, rebound or guarding.  PELVIC: Normal appearing external genitalia; normal appearing vaginal mucosa and cervix.  No abnormal discharge noted.  Pap smear obtained. Pelvic cultures obtained. Normal uterine size, no other palpable masses, no uterine or adnexal tenderness. MUSCULOSKELETAL: Normal range of motion. No tenderness.  No cyanosis, clubbing, or edema.  2+ distal pulses.  Exam done with chaperone present.  Assessment and Plan:   1. Well woman exam Screening colonoscopy needed - Ambulatory referral to Gastroenterology  2. Cervical cancer screening - Cytology - PAP( [facility])  3. Routine screening for STI (sexually transmitted infection) - Cervicovaginal ancillary only( [place]) - HIV antibody (with reflex) - Hepatitis B surface antigen - Hepatitis C antibody - RPR  4. Skin lesion of left lower limb - Ambulatory referral to Dermatology  5. Dysmenorrhea - ibuprofen (ADVIL) 800 MG tablet; Take 1 tablet (800 mg total) by mouth every 8 (eight) hours as  needed.  Dispense: 60 tablet; Refill: 3    Will follow up results of pap smear/STI screen and manage accordingly. Encouraged improvement in diet and exercise.  COVID vaccine UTD Desires STI screen. Mammogram UTD Referral for colonoscopy today DEXA not due based on age  Routine preventative health maintenance measures emphasized. Please refer to After Visit Summary for other counseling recommendations.   Total face-to-face time with patient: 30 minutes. Over 50% of encounter was spent on counseling and coordination of care.   Feliz Beam, M.D. Attending Center for [employer] [facility] farm manager)

## 2020-01-05 NOTE — Progress Notes (Signed)
GYN presents for AEX/PAP/STD check. c/o numbness in RA and Left leg.  Last PAP 10/02/2017 NIL Next Mammogram 11/23/2020

## 2020-01-06 LAB — CERVICOVAGINAL ANCILLARY ONLY
Bacterial Vaginitis (gardnerella): POSITIVE — AB
Candida Glabrata: NEGATIVE
Candida Vaginitis: NEGATIVE
Chlamydia: NEGATIVE
Comment: NEGATIVE
Comment: NEGATIVE
Comment: NEGATIVE
Comment: NEGATIVE
Comment: NEGATIVE
Comment: NORMAL
Neisseria Gonorrhea: NEGATIVE
Trichomonas: NEGATIVE

## 2020-01-06 LAB — HEPATITIS C ANTIBODY: Hep C Virus Ab: 0.1 s/co ratio (ref 0.0–0.9)

## 2020-01-06 LAB — HIV ANTIBODY (ROUTINE TESTING W REFLEX): HIV Screen 4th Generation wRfx: NONREACTIVE

## 2020-01-06 LAB — RPR: RPR Ser Ql: NONREACTIVE

## 2020-01-06 LAB — HEPATITIS B SURFACE ANTIGEN: Hepatitis B Surface Ag: NEGATIVE

## 2020-01-07 ENCOUNTER — Encounter: Payer: Self-pay | Admitting: Gastroenterology

## 2020-01-07 LAB — CYTOLOGY - PAP
Comment: NEGATIVE
Diagnosis: NEGATIVE
High risk HPV: NEGATIVE

## 2020-02-25 ENCOUNTER — Other Ambulatory Visit: Payer: Self-pay

## 2020-02-25 ENCOUNTER — Ambulatory Visit (AMBULATORY_SURGERY_CENTER): Payer: Self-pay | Admitting: *Deleted

## 2020-02-25 VITALS — Ht 59.0 in | Wt 178.0 lb

## 2020-02-25 DIAGNOSIS — Z1211 Encounter for screening for malignant neoplasm of colon: Secondary | ICD-10-CM

## 2020-02-25 NOTE — Progress Notes (Signed)

## 2020-03-10 ENCOUNTER — Other Ambulatory Visit: Payer: Self-pay

## 2020-03-10 ENCOUNTER — Ambulatory Visit (AMBULATORY_SURGERY_CENTER): Payer: BC Managed Care – PPO | Admitting: Gastroenterology

## 2020-03-10 ENCOUNTER — Encounter: Payer: Self-pay | Admitting: Gastroenterology

## 2020-03-10 VITALS — BP 99/69 | HR 61 | Temp 97.7°F | Resp 14 | Ht 59.0 in | Wt 178.0 lb

## 2020-03-10 DIAGNOSIS — D124 Benign neoplasm of descending colon: Secondary | ICD-10-CM

## 2020-03-10 DIAGNOSIS — Z8371 Family history of colonic polyps: Secondary | ICD-10-CM

## 2020-03-10 DIAGNOSIS — K635 Polyp of colon: Secondary | ICD-10-CM

## 2020-03-10 DIAGNOSIS — Z1211 Encounter for screening for malignant neoplasm of colon: Secondary | ICD-10-CM

## 2020-03-10 DIAGNOSIS — D123 Benign neoplasm of transverse colon: Secondary | ICD-10-CM

## 2020-03-10 MED ORDER — SODIUM CHLORIDE 0.9 % IV SOLN: 500.0000 mL | Freq: Once | INTRAVENOUS | Status: DC

## 2020-03-10 NOTE — Op Note (Signed)
[facility] Patient Name: Wanda Foster Procedure Date: 03/10/2020 11:25 AM MRN: [id number] Endoscopist: Thornton Park MD, MD Age: 49 Referring MD:  Date of Birth: 07/04/1970 Gender: Female Account #: [id number] Procedure:                Colonoscopy Indications:              Screening for colorectal malignant neoplasm, This                            is the patient's first colonoscopy                           Father may have had colon polyps                           No known family history of colon cancer Medicines:                Monitored Anesthesia Care Procedure:                Pre-Anesthesia Assessment:                           - Prior to the procedure, a History and Physical                            was performed, and patient medications and                            allergies were reviewed. The patient's tolerance of                            previous anesthesia was also reviewed. The risks                            and benefits of the procedure and the sedation                            options and risks were discussed with the patient.                            All questions were answered, and informed consent                            was obtained. Prior Anticoagulants: The patient has                            taken no previous anticoagulant or antiplatelet                            agents. ASA Grade Assessment: II - A patient with                            mild systemic disease. After reviewing the risks  and benefits, the patient was deemed in                            satisfactory condition to undergo the procedure.                           After obtaining informed consent, the colonoscope                            was passed under direct vision. Throughout the                            procedure, the patient's blood pressure, pulse, and                            oxygen saturations were monitored continuously. The                             Colonoscope was introduced through the anus and                            advanced to the 3 cm into the ileum. The                            colonoscopy was performed without difficulty. The                            patient tolerated the procedure well. The quality                            of the bowel preparation was good. The terminal                            ileum, ileocecal valve, appendiceal orifice, and                            rectum were photographed. Scope In: 11:39:41 AM Scope Out: 11:53:18 AM Scope Withdrawal Time: 0 hours 10 minutes 38 seconds  Total Procedure Duration: 0 hours 13 minutes 37 seconds  Findings:                 The perianal and digital rectal examinations were                            normal.                           Two sessile polyps were found in the proximal                            descending colon and splenic flexure. The polyps                            were 2 mm in size. These polyps were removed with a  cold snare. Resection and retrieval were complete.                            Estimated blood loss was minimal.                           The exam was otherwise without abnormality on                            direct and retroflexion views except for small                            internal hemorrhoids. Complications:            No immediate complications. Estimated blood loss:                            Minimal. Estimated Blood Loss:     Estimated blood loss was minimal. Impression:               - Two 2 mm polyps in the proximal descending colon                            and at the splenic flexure, removed with a cold                            snare. Resected and retrieved.                           - The examination was otherwise normal on direct                            and retroflexion views. Recommendation:           - Patient has a contact number available for                             emergencies. The signs and symptoms of potential                            delayed complications were discussed with the                            patient. Return to normal activities tomorrow.                            Written discharge instructions were provided to the                            patient.                           - Resume previous diet.                           - Continue present medications.                           -  Await pathology results.                           - Repeat colonoscopy date to be determined after                            pending pathology results are reviewed for                            surveillance.                           - Emerging evidence supports eating a diet of                            fruits, vegetables, grains, calcium, and yogurt                            while reducing red meat and alcohol may reduce the                            risk of colon cancer.                           - Thank you for allowing me to be involved in your                            colon cancer prevention. Thornton Park MD, MD 03/10/2020 12:00:16 PM This report has been signed electronically.

## 2020-03-10 NOTE — Patient Instructions (Signed)
Handout on polyps given. ° °YOU HAD AN ENDOSCOPIC PROCEDURE TODAY AT THE [facility]:   Refer to the procedure report that was given to you for any specific questions about what was found during the examination.  If the procedure report does not answer your questions, please call your gastroenterologist to clarify.  If you requested that your care partner not be given the details of your procedure findings, then the procedure report has been included in a sealed envelope for you to review at your convenience later. ° °YOU SHOULD EXPECT: Some feelings of bloating in the abdomen. Passage of more gas than usual.  Walking can help get rid of the air that was put into your GI tract during the procedure and reduce the bloating. If you had a lower endoscopy (such as a colonoscopy or flexible sigmoidoscopy) you may notice spotting of blood in your stool or on the toilet paper. If you underwent a bowel prep for your procedure, you may not have a normal bowel movement for a few days. ° °Please Note:  You might notice some irritation and congestion in your nose or some drainage.  This is from the oxygen used during your procedure.  There is no need for concern and it should clear up in a day or so. ° °SYMPTOMS TO REPORT IMMEDIATELY: ° °Following lower endoscopy (colonoscopy or flexible sigmoidoscopy): ° Excessive amounts of blood in the stool ° Significant tenderness or worsening of abdominal pains ° Swelling of the abdomen that is new, acute ° Fever of 100°F or higher ° °For urgent or emergent issues, a gastroenterologist can be reached at any hour by calling [phone]. °Do not use MyChart messaging for urgent concerns.  ° ° °DIET:  We do recommend a small meal at first, but then you may proceed to your regular diet.  Drink plenty of fluids but you should avoid alcoholic beverages for 24 hours. ° °ACTIVITY:  You should plan to take it easy for the rest of today and you should NOT DRIVE or use heavy machinery  until tomorrow (because of the sedation medicines used during the test).   ° °FOLLOW UP: °Our staff will call the number listed on your records 48-72 hours following your procedure to check on you and address any questions or concerns that you may have regarding the information given to you following your procedure. If we do not reach you, we will leave a message.  We will attempt to reach you two times.  During this call, we will ask if you have developed any symptoms of COVID 19. If you develop any symptoms (ie: fever, flu-like symptoms, shortness of breath, cough etc.) before then, please call [phone].  If you test positive for Covid 19 in the 2 weeks post procedure, please call and report this information to us.   ° °If any biopsies were taken you will be contacted by phone or by letter within the next 1-3 weeks.  Please call us at [phone] if you have not heard about the biopsies in 3 weeks.  ° ° °SIGNATURES/CONFIDENTIALITY: °You and/or your care partner have signed paperwork which will be entered into your electronic medical record.  These signatures attest to the fact that that the information above on your After Visit Summary has been reviewed and is understood.  Full responsibility of the confidentiality of this discharge information lies with you and/or your care-partner.  °

## 2020-03-10 NOTE — Progress Notes (Signed)
Called to room to assist during endoscopic procedure.  Patient ID and intended procedure confirmed with present staff. Received instructions for my participation in the procedure from the performing physician.  

## 2020-03-10 NOTE — Progress Notes (Signed)
Report to PACU, RN, vss, BBS= Clear.  

## 2020-03-10 NOTE — Progress Notes (Signed)
Pt's states no medical or surgical changes since previsit or office visit. VS by CW. 

## 2020-03-14 ENCOUNTER — Telehealth: Payer: Self-pay

## 2020-03-14 ENCOUNTER — Telehealth: Payer: Self-pay | Admitting: *Deleted

## 2020-03-14 NOTE — Telephone Encounter (Signed)
  Follow up Call-  Call back number 03/10/2020  Post procedure Call Back phone  # [phone]  Permission to leave phone message Yes  Some recent data might be hidden     No answer at 2nd attempt follow up phone call.  Left message on voicemail.

## 2020-03-14 NOTE — Telephone Encounter (Signed)
Left message with father Wanda Foster regarding follow up call.

## 2020-03-17 ENCOUNTER — Encounter: Payer: Self-pay | Admitting: Gastroenterology

## 2020-12-22 IMAGING — MG DIGITAL SCREENING BILAT W/ TOMO W/ CAD
8 series · 8 of 24 positions shown · non-contrast
Comparison: Previous exam(s).

CLINICAL DATA: Screening.

EXAM:
DIGITAL SCREENING BILATERAL MAMMOGRAM WITH TOMO AND CAD

[L MLO synth-2D]
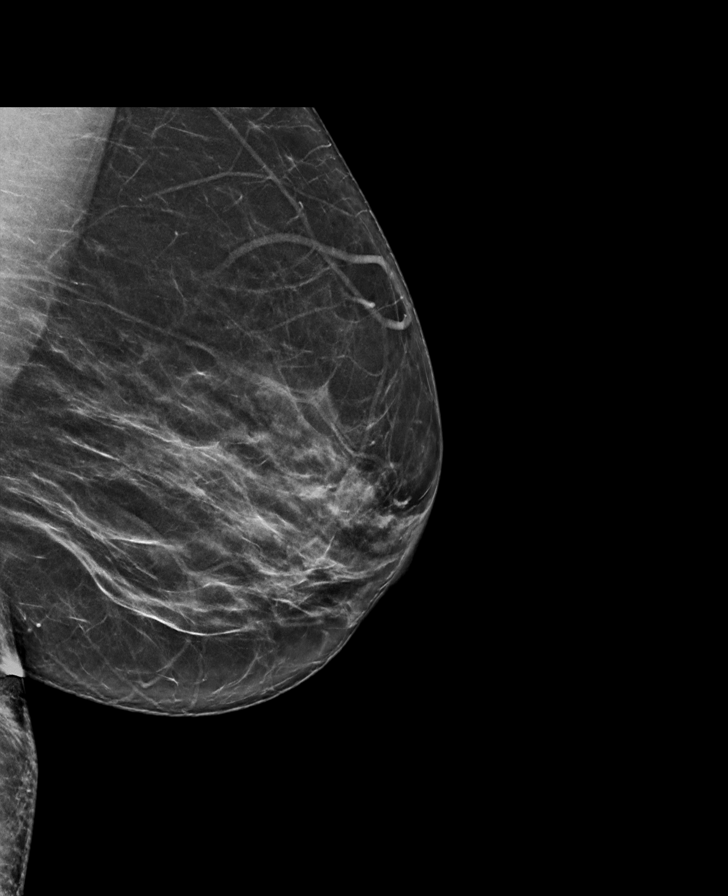

[R MLO synth-2D]
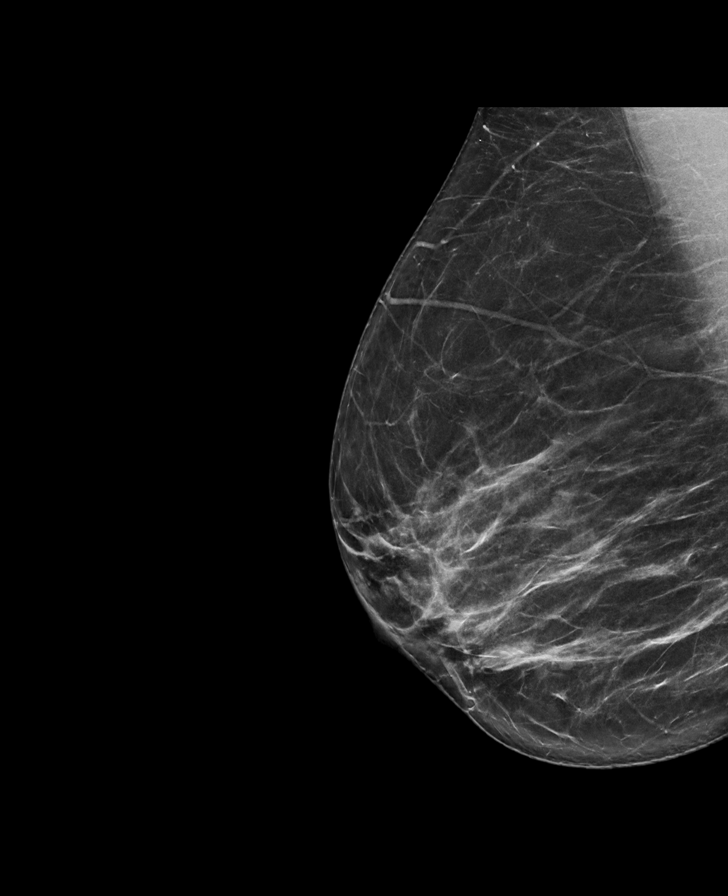

[L CC synth-2D]
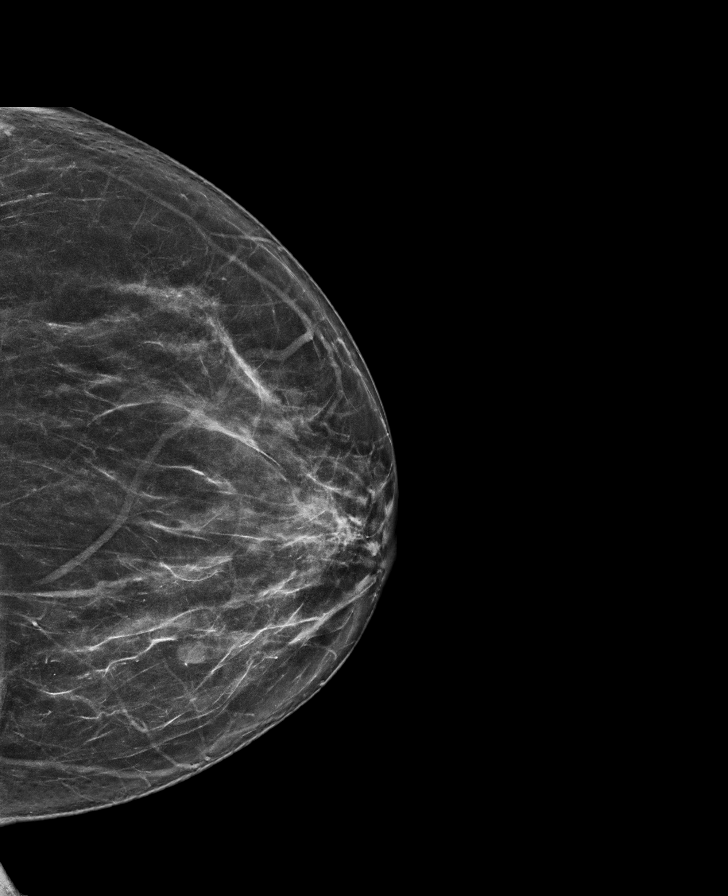

[R CC synth-2D]
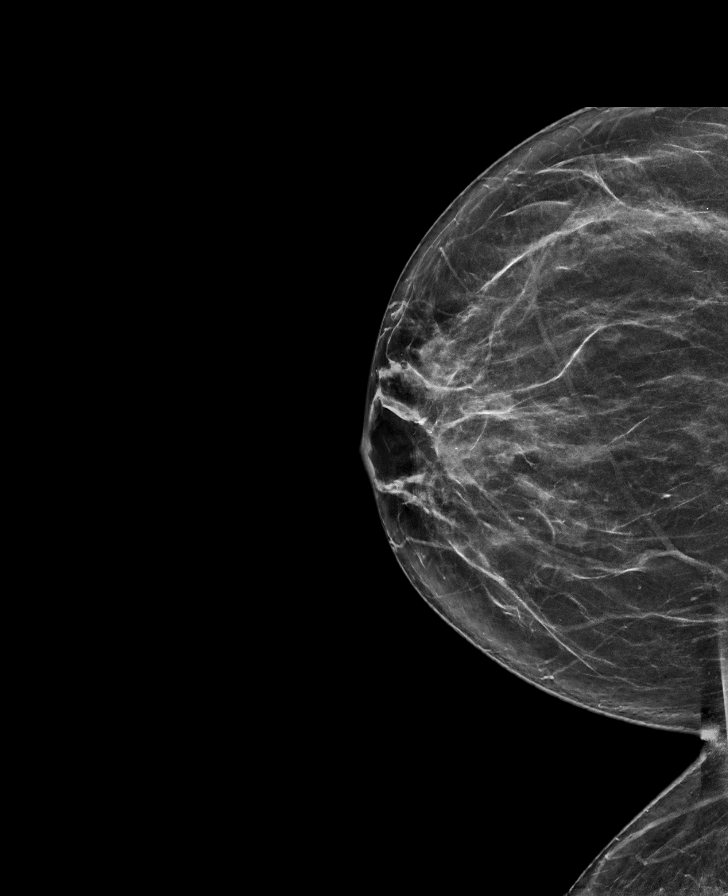

[L CC tomo · tomo slice 37/72.0]
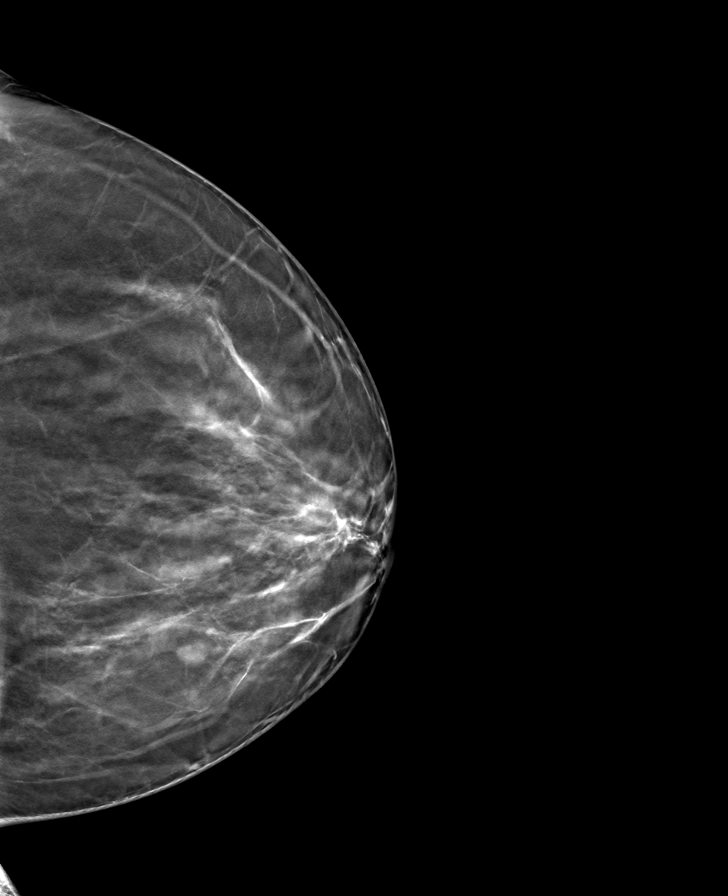

[R MLO tomo · tomo slice 38/75.0]
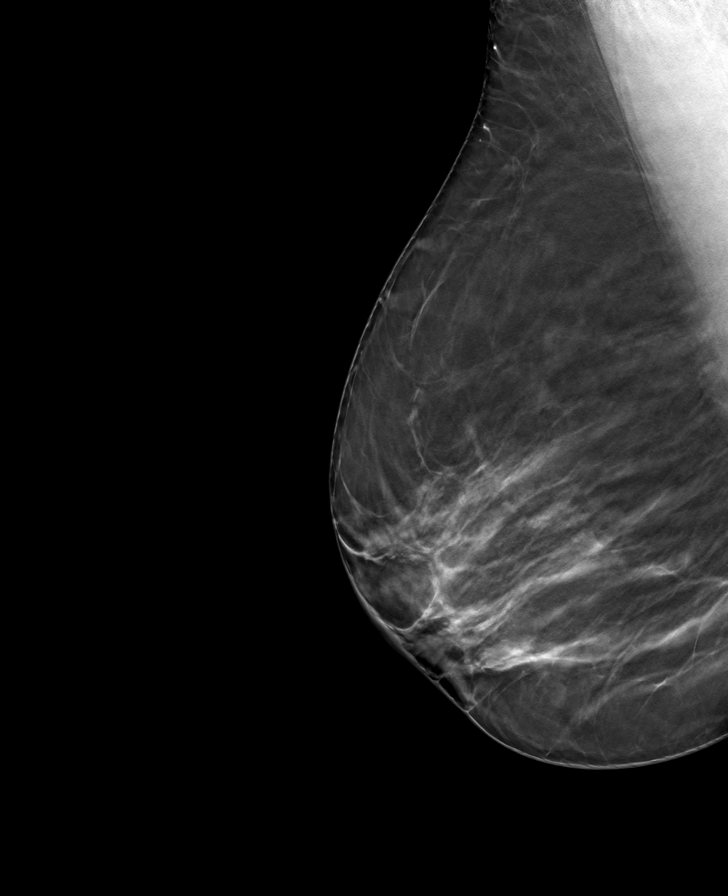

[L MLO tomo · tomo slice 37/74.0]
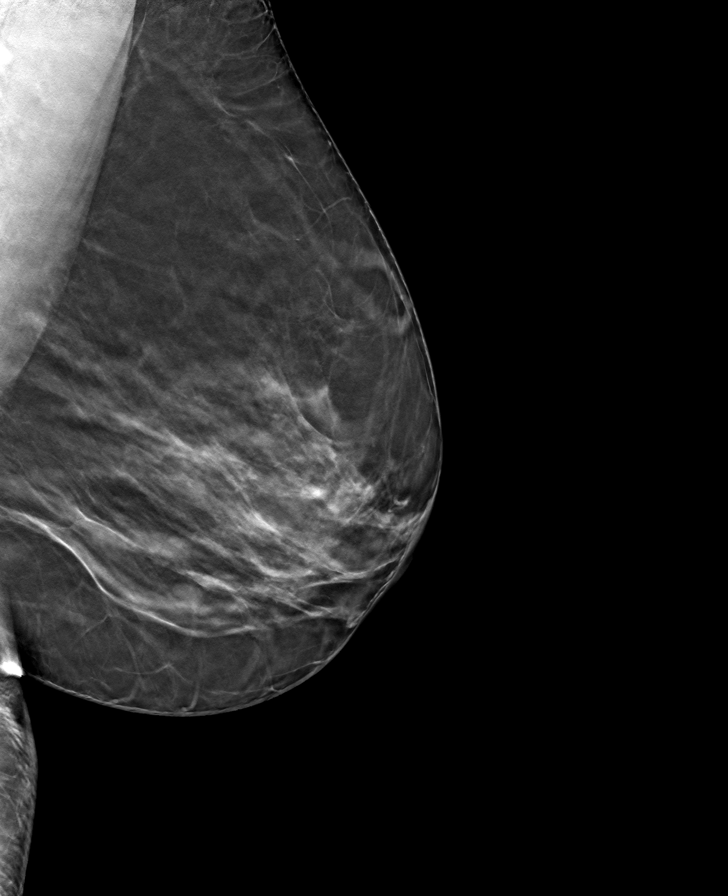

[R CC tomo · tomo slice 37/74.0]
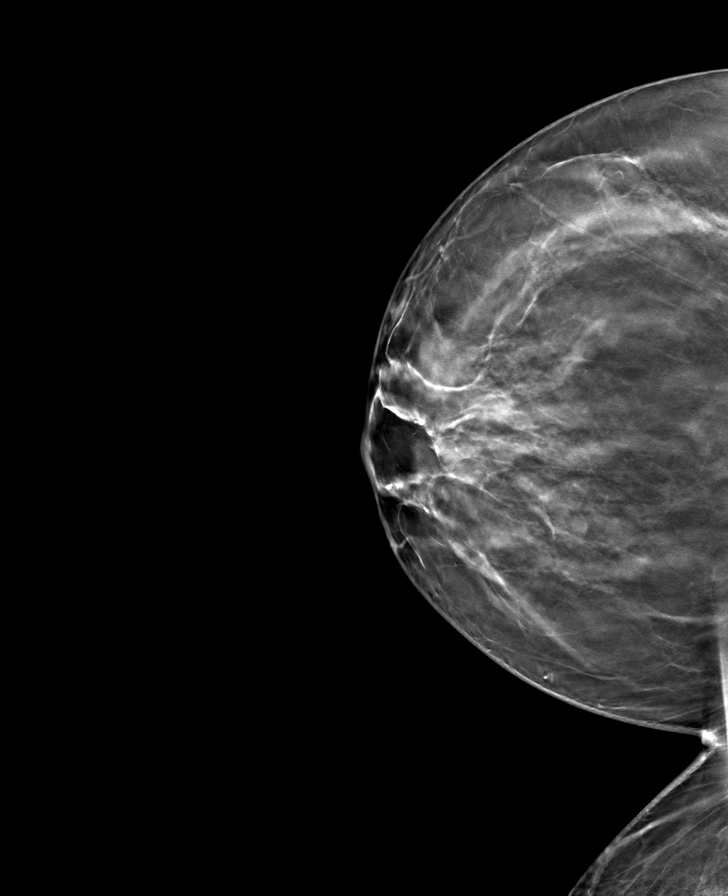

[8 of 24 positions shown; findings below may reference images not displayed]

ACR Breast Density Category c: The breast tissue is heterogeneously
dense, which may obscure small masses.
FINDINGS: There are no findings suspicious for malignancy. Images were
processed with CAD.
IMPRESSION: No mammographic evidence of malignancy. A result letter of this
screening mammogram will be mailed directly to the patient.

RECOMMENDATION:
Screening mammogram in one year. (Code:[id number]-[id number])

BI-RADS CATEGORY  1: Negative.

## 2021-01-08 ENCOUNTER — Encounter: Payer: Self-pay | Admitting: Obstetrics

## 2021-01-08 ENCOUNTER — Other Ambulatory Visit: Payer: Self-pay

## 2021-01-08 ENCOUNTER — Ambulatory Visit (INDEPENDENT_AMBULATORY_CARE_PROVIDER_SITE_OTHER): Payer: Commercial Managed Care - PPO | Admitting: Obstetrics

## 2021-01-08 ENCOUNTER — Other Ambulatory Visit (HOSPITAL_COMMUNITY)
Admission: RE | Admit: 2021-01-08 | Discharge: 2021-01-08 | Disposition: A | Discharge status: Home / Self Care | Payer: Commercial Managed Care - PPO | Source: Ambulatory Visit | Attending: Obstetrics | Admitting: Obstetrics

## 2021-01-08 VITALS — BP 117/80 | HR 64 | Ht 59.0 in | Wt 178.3 lb

## 2021-01-08 DIAGNOSIS — B9689 Other specified bacterial agents as the cause of diseases classified elsewhere: Secondary | ICD-10-CM | POA: Insufficient documentation

## 2021-01-08 DIAGNOSIS — E669 Obesity, unspecified: Secondary | ICD-10-CM

## 2021-01-08 DIAGNOSIS — Z1239 Encounter for other screening for malignant neoplasm of breast: Secondary | ICD-10-CM | POA: Diagnosis not present

## 2021-01-08 DIAGNOSIS — N946 Dysmenorrhea, unspecified: Secondary | ICD-10-CM

## 2021-01-08 DIAGNOSIS — Z113 Encounter for screening for infections with a predominantly sexual mode of transmission: Secondary | ICD-10-CM

## 2021-01-08 DIAGNOSIS — Z01419 Encounter for gynecological examination (general) (routine) without abnormal findings: Secondary | ICD-10-CM | POA: Insufficient documentation

## 2021-01-08 DIAGNOSIS — N898 Other specified noninflammatory disorders of vagina: Secondary | ICD-10-CM | POA: Diagnosis not present

## 2021-01-08 DIAGNOSIS — N760 Acute vaginitis: Secondary | ICD-10-CM | POA: Diagnosis not present

## 2021-01-08 DIAGNOSIS — N951 Menopausal and female climacteric states: Secondary | ICD-10-CM

## 2021-01-08 MED ORDER — IBUPROFEN 800 MG PO TABS: 800.0000 mg | ORAL_TABLET | Freq: Three times a day (TID) | ORAL | 5 refills | Status: DC | PRN

## 2021-01-08 NOTE — Progress Notes (Signed)
Subjective:        Wanda Foster is a 50 y.o. female here for a routine exam.  Current complaints: Vaginal discharge and hot flashes  Personal health questionnaire:  Is patient Ashkenazi Jewish, have a family history of breast and/or ovarian cancer: no Is there a family history of uterine cancer diagnosed at age < 41, gastrointestinal cancer, urinary tract cancer, family member who is a Field seismologist syndrome-associated carrier: no Is the patient overweight and hypertensive, family history of diabetes, personal history of gestational diabetes, preeclampsia or PCOS: no Is patient over 2, have PCOS,  family history of premature CHD under age 23, diabetes, smoke, have hypertension or peripheral artery disease:  no At any time, has a partner hit, kicked or otherwise hurt or frightened you?: no Over the past 2 weeks, have you felt down, depressed or hopeless?: no Over the past 2 weeks, have you felt little interest or pleasure in doing things?:no   Gynecologic History No LMP recorded. Contraception: tubal ligation Last Pap: 01-05-2020. Results were: normal Last mammogram: 11-24-2019. Results were: normal  Obstetric History OB History  Gravida Para Term Preterm AB Living  '4 2 2   2 2  '$ SAB IAB Ectopic Multiple Live Births    2          # Outcome Date GA Lbr Len/2nd Weight Sex Delivery Anes PTL Lv  4 Term 10/04/92 [redacted]w[redacted]d  F CS-LTranv     3 Term 01/14/91 [id number] F Vag-Spont     2 IAB           1 IAB             Past Medical History:  Diagnosis Date   Allergy    Sickle cell anemia (HCC)    sickle cell trait   Sickle cell trait ([facility]    Past Surgical History:  Procedure Laterality Date   BREAST BIOPSY  1998   unsure which breast   CESAREAN SECTION  1994   TUBAL LIGATION  1996     Current Outpatient Medications:    BIOTIN PO, Take by mouth. , Disp: , Rfl:    ibuprofen (ADVIL) 800 MG tablet, Take 1 tablet (800 mg total) by mouth every 8 (eight) hours as needed., Disp: 60 tablet,  Rfl: 5 No Known Allergies  Social History   Tobacco Use   Smoking status: Never   Smokeless tobacco: Never  Substance Use Topics   Alcohol use: Yes    Comment: occ    Family History  Problem Relation Age of Onset   Asthma Sister    Sickle cell trait Sister    Sickle cell trait Brother    Diabetes Maternal Grandmother    Hypertension Maternal Grandmother    Cervical cancer Maternal Grandmother 60   Diabetes Maternal Grandfather    Hypertension Maternal Grandfather    Diabetes Paternal Grandmother    Hypertension Paternal Grandmother    Diabetes Paternal Grandfather    Hypertension Paternal Grandfather    Breast cancer Neg Hx    Colon polyps Neg Hx    Colon cancer Neg Hx    Esophageal cancer Neg Hx    Rectal cancer Neg Hx    Stomach cancer Neg Hx       Review of Systems  Constitutional: negative for fatigue and weight loss Respiratory: negative for cough and wheezing Cardiovascular: negative for chest pain, fatigue and palpitations Gastrointestinal: negative for abdominal pain and change in bowel habits Musculoskeletal:negative for  myalgias Neurological: negative for gait problems and tremors Behavioral/Psych: negative for abusive relationship, depression Endocrine: negative for temperature intolerance    Genitourinary: positive for vaginal discharge.  negative for abnormal menstrual periods, genital lesions, hot flashes, sexual problems  Integument/breast: negative for breast lump, breast tenderness, nipple discharge and skin lesion(s)    Objective:       BP 117/80   Pulse 64   Ht '4\' 11"'$  (1.499 m)   Wt 178 lb 4.8 oz (80.9 kg)   BMI 36.01 kg/m  General:   Alert and no distress  Skin:   no rash or abnormalities  Lungs:   clear to auscultation bilaterally  Heart:   regular rate and rhythm, S1, S2 normal, no murmur, click, rub or gallop  Breasts:   normal without suspicious masses, skin or nipple changes or axillary nodes  Abdomen:  normal findings: no  organomegaly, soft, non-tender and no hernia  Pelvis:  External genitalia: normal general appearance Urinary system: urethral meatus normal and bladder without fullness, nontender Vaginal: normal without tenderness, induration or masses Cervix: normal appearance Adnexa: normal bimanual exam Uterus: anteverted and non-tender, normal size   Lab Review Urine pregnancy test Labs reviewed yes Radiologic studies reviewed yes  Assessment:    1. Encounter for routine gynecological examination with Papanicolaou smear of cervix Rx: - Cytology - PAP  2. Vaginal discharge Rx: - Cervicovaginal ancillary only  3. Screening for STD (sexually transmitted disease) Rx: - Hepatitis C antibody - Hepatitis B surface antigen - RPR - HIV Antibody (routine testing w rflx)  4. Screening breast examination Rx: - MM 3D SCREEN BREAST BILATERAL; Future  5. Dysmenorrhea Rx: - ibuprofen (ADVIL) 800 MG tablet; Take 1 tablet (800 mg total) by mouth every 8 (eight) hours as needed.  Dispense: 60 tablet; Refill: 5  6. Obesity (BMI 35.0-39.9 without comorbidity) - weight reduction with the aid of dietary changes, exercise and behavioral modification recommended  7. Hot flashes - discussed causes and treatment options - educational material dispensed    Plan:    Education reviewed: calcium supplements, depression evaluation, low fat, low cholesterol diet, safe sex/STD prevention, self breast exams, skin cancer screening, and weight bearing exercise. Mammogram ordered. Follow up in: 1 year.    Orders Placed This Encounter  Procedures   MM 3D SCREEN BREAST BILATERAL    Standing Status:   Future    Standing Expiration Date:   01/08/2022    Order Specific Question:   Reason for Exam (SYMPTOM  OR DIAGNOSIS REQUIRED)    Answer:   Needs routine screening    Order Specific Question:   Is the patient pregnant?    Answer:   No    Order Specific Question:   Preferred imaging location?    Answer:    [facility]   Hepatitis C antibody   Hepatitis B surface antigen   RPR   HIV Antibody (routine testing w rflx)     Shelly Bombard, MD 01/08/2021 9:24 AM

## 2021-01-09 ENCOUNTER — Other Ambulatory Visit: Payer: Self-pay | Admitting: Obstetrics

## 2021-01-09 DIAGNOSIS — N760 Acute vaginitis: Secondary | ICD-10-CM

## 2021-01-09 DIAGNOSIS — B9689 Other specified bacterial agents as the cause of diseases classified elsewhere: Secondary | ICD-10-CM

## 2021-01-09 LAB — HEPATITIS C ANTIBODY: Hep C Virus Ab: <0.1 s/co ratio (ref 0.0–0.9)

## 2021-01-09 LAB — CERVICOVAGINAL ANCILLARY ONLY
Bacterial Vaginitis (gardnerella): POSITIVE — AB
Candida Glabrata: NEGATIVE
Candida Vaginitis: NEGATIVE
Chlamydia: NEGATIVE
Comment: NEGATIVE
Comment: NEGATIVE
Comment: NEGATIVE
Comment: NEGATIVE
Comment: NEGATIVE
Comment: NORMAL
Neisseria Gonorrhea: NEGATIVE
Trichomonas: NEGATIVE

## 2021-01-09 LAB — HIV ANTIBODY (ROUTINE TESTING W REFLEX): HIV Screen 4th Generation wRfx: NONREACTIVE

## 2021-01-09 LAB — HEPATITIS B SURFACE ANTIGEN: Hepatitis B Surface Ag: NEGATIVE

## 2021-01-09 LAB — RPR: RPR Ser Ql: NONREACTIVE

## 2021-01-09 MED ORDER — METRONIDAZOLE 500 MG PO TABS: 500.0000 mg | ORAL_TABLET | Freq: Two times a day (BID) | ORAL | 2 refills | Status: AC

## 2021-01-10 LAB — CYTOLOGY - PAP
Adequacy: ABSENT
Comment: NEGATIVE
Diagnosis: NEGATIVE
High risk HPV: NEGATIVE

## 2021-01-16 ENCOUNTER — Ambulatory Visit (HOSPITAL_BASED_OUTPATIENT_CLINIC_OR_DEPARTMENT_OTHER): Payer: Commercial Managed Care - PPO | Admitting: Radiology

## 2021-01-17 DIAGNOSIS — N760 Acute vaginitis: Secondary | ICD-10-CM

## 2021-01-17 DIAGNOSIS — B9689 Other specified bacterial agents as the cause of diseases classified elsewhere: Secondary | ICD-10-CM

## 2021-01-17 MED ORDER — METRONIDAZOLE 0.75 % VA GEL: 1.0000 | Freq: Two times a day (BID) | VAGINAL | 0 refills | Status: AC

## 2021-06-01 ENCOUNTER — Other Ambulatory Visit: Payer: Self-pay | Admitting: Obstetrics and Gynecology

## 2021-06-01 DIAGNOSIS — N946 Dysmenorrhea, unspecified: Secondary | ICD-10-CM

## 2022-02-25 ENCOUNTER — Telehealth: Payer: Self-pay | Admitting: *Deleted

## 2022-02-25 NOTE — Telephone Encounter (Signed)
Returned TC to pt regarding date of last annual. Date 01/08/21 given. Call transferred to schedulers.

## 2022-04-02 ENCOUNTER — Ambulatory Visit: Payer: Commercial Managed Care - PPO | Admitting: Obstetrics

## 2022-05-09 ENCOUNTER — Ambulatory Visit: Payer: BLUE CROSS/BLUE SHIELD | Admitting: Obstetrics

## 2022-08-30 ENCOUNTER — Ambulatory Visit: Payer: BLUE CROSS/BLUE SHIELD | Admitting: Family Medicine

## 2022-10-10 ENCOUNTER — Ambulatory Visit: Payer: BLUE CROSS/BLUE SHIELD

## 2022-12-30 ENCOUNTER — Ambulatory Visit: Payer: Commercial Managed Care - HMO | Admitting: Advanced Practice Midwife
